# Patient Record
Sex: Male | Born: 1985 | Race: White | Hispanic: No | Marital: Married | Smoking: Current every day smoker
Health system: Southern US, Community
[De-identification: ages and names within clinical notes are randomized; demographics above are authoritative.]

## PROBLEM LIST (undated history)

## (undated) DIAGNOSIS — J189 Pneumonia, unspecified organism: Secondary | ICD-10-CM

## (undated) DIAGNOSIS — I456 Pre-excitation syndrome: Secondary | ICD-10-CM

---
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
## 2011-09-13 ENCOUNTER — Emergency Department (HOSPITAL_COMMUNITY)
Admission: EM | Admit: 2011-09-13 | Discharge: 2011-09-13 | Disposition: A | Discharge status: Home / Self Care | Payer: Self-pay | Attending: Emergency Medicine | Admitting: Emergency Medicine

## 2011-09-13 ENCOUNTER — Emergency Department (HOSPITAL_COMMUNITY): Payer: Self-pay

## 2011-09-13 DIAGNOSIS — Y998 Other external cause status: Secondary | ICD-10-CM | POA: Insufficient documentation

## 2011-09-13 DIAGNOSIS — Y9389 Activity, other specified: Secondary | ICD-10-CM | POA: Insufficient documentation

## 2011-09-13 DIAGNOSIS — W3302XA Accidental discharge of hunting rifle, initial encounter: Secondary | ICD-10-CM | POA: Insufficient documentation

## 2011-09-13 DIAGNOSIS — S92309B Fracture of unspecified metatarsal bone(s), unspecified foot, initial encounter for open fracture: Secondary | ICD-10-CM | POA: Insufficient documentation

## 2011-09-13 DIAGNOSIS — W3400XA Accidental discharge from unspecified firearms or gun, initial encounter: Secondary | ICD-10-CM

## 2011-09-13 MED ORDER — SODIUM CHLORIDE 0.9 % IV SOLN: 1.0000 mg/h | INTRAVENOUS | Status: DC

## 2011-09-13 MED ORDER — HYDROMORPHONE HCL PF 1 MG/ML IJ SOLN
1.0000 mg | Freq: Once | INTRAMUSCULAR | Status: AC
  Administered 2011-09-13: 1 mg via INTRAVENOUS
  Filled 2011-09-13: qty 1

## 2011-09-13 MED ORDER — CEPHALEXIN 500 MG PO CAPS: 500.0000 mg | ORAL_CAPSULE | Freq: Four times a day (QID) | ORAL | Status: AC

## 2011-09-13 MED ORDER — HYDROMORPHONE BOLUS VIA INFUSION: 1.0000 mg | Freq: Once | INTRAVENOUS | Status: DC

## 2011-09-13 MED ORDER — OXYCODONE-ACETAMINOPHEN 5-325 MG PO TABS: 1.0000 | ORAL_TABLET | Freq: Four times a day (QID) | ORAL | Status: DC | PRN

## 2011-09-13 MED ORDER — HYDROMORPHONE HCL PF 1 MG/ML IJ SOLN
INTRAMUSCULAR | Status: AC
  Administered 2011-09-13: 1 mg
  Filled 2011-09-13: qty 1
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
## 2011-09-13 NOTE — Progress Notes (Signed)
Orthopedic Tech Progress Note Patient Details:  Mason Coleman [birth year] [id number]  Ortho Devices Type of Ortho Device: Crutches;Postop boot Ortho Device/Splint Location: (L) LE Ortho Device/Splint Interventions: Application   [name] 09/13/2011, 4:29 PM

## 2011-09-13 NOTE — ED Notes (Signed)
Wound care with 4x4, korlex, and saline
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
## 2011-10-17 ENCOUNTER — Encounter (HOSPITAL_COMMUNITY): Payer: Self-pay | Admitting: Emergency Medicine

## 2011-10-17 ENCOUNTER — Emergency Department (HOSPITAL_COMMUNITY)
Admission: EM | Admit: 2011-10-17 | Discharge: 2011-10-17 | Disposition: A | Discharge status: Home / Self Care | Payer: Self-pay | Attending: Emergency Medicine | Admitting: Emergency Medicine

## 2011-10-17 DIAGNOSIS — F1910 Other psychoactive substance abuse, uncomplicated: Secondary | ICD-10-CM

## 2011-10-17 DIAGNOSIS — F1610 Hallucinogen abuse, uncomplicated: Secondary | ICD-10-CM | POA: Insufficient documentation

## 2011-10-17 DIAGNOSIS — F17200 Nicotine dependence, unspecified, uncomplicated: Secondary | ICD-10-CM | POA: Insufficient documentation

## 2011-10-17 LAB — RAPID URINE DRUG SCREEN, HOSP PERFORMED
Amphetamines: NOT DETECTED
Barbiturates: NOT DETECTED
Benzodiazepines: NOT DETECTED
Cocaine: NOT DETECTED
Tetrahydrocannabinol: POSITIVE — AB

## 2011-10-17 LAB — COMPREHENSIVE METABOLIC PANEL
ALT: 21 U/L (ref 0–53)
AST: 14 U/L (ref 0–37)
Alkaline Phosphatase: 79 U/L (ref 39–117)
Calcium: 10.5 mg/dL (ref 8.4–10.5)
Potassium: 4 mEq/L (ref 3.5–5.1)
Sodium: 141 mEq/L (ref 135–145)
Total Protein: 8.4 g/dL — ABNORMAL HIGH (ref 6.0–8.3)

## 2011-10-17 LAB — CBC
MCH: 27.2 pg (ref 26.0–34.0)
MCHC: 33.1 g/dL (ref 30.0–36.0)
Platelets: 232 10*3/uL (ref 150–400)
RBC: 4.86 MIL/uL (ref 4.22–5.81)

## 2011-10-17 MED ORDER — NICOTINE 21 MG/24HR TD PT24
21.0000 mg | MEDICATED_PATCH | Freq: Every day | TRANSDERMAL | Status: DC
  Administered 2011-10-17: 21 mg via TRANSDERMAL
  Filled 2011-10-17: qty 1

## 2011-10-17 MED ORDER — LORAZEPAM 1 MG PO TABS
1.0000 mg | ORAL_TABLET | Freq: Three times a day (TID) | ORAL | Status: DC | PRN
  Administered 2011-10-17: 1 mg via ORAL
  Filled 2011-10-17: qty 1

## 2011-10-17 MED ORDER — ACETAMINOPHEN 325 MG PO TABS: 650.0000 mg | ORAL_TABLET | ORAL | Status: DC | PRN

## 2011-10-17 MED ORDER — ALUM & MAG HYDROXIDE-SIMETH 200-200-20 MG/5ML PO SUSP: 30.0000 mL | ORAL | Status: DC | PRN

## 2011-10-17 MED ORDER — ONDANSETRON HCL 8 MG PO TABS: 4.0000 mg | ORAL_TABLET | Freq: Three times a day (TID) | ORAL | Status: DC | PRN

## 2011-10-17 MED ORDER — ZOLPIDEM TARTRATE 5 MG PO TABS: 5.0000 mg | ORAL_TABLET | Freq: Every evening | ORAL | Status: DC | PRN
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
## 2011-10-17 NOTE — BH Assessment (Signed)
Assessment Note  Update:  Pt requesting to talk to social worker regarding a place to live as he is homeless.  Pt stated he no longer wants detox.  Pt seen by Frederico Hamman, CSW for homeless referrals as well as was given outpatient SA referrals.  EDP Pickering discharged home per pt request.  ED staff updated.     Disposition:  Disposition Disposition of Patient: Referred to;Outpatient treatment Type of inpatient treatment program: Adult Type of outpatient treatment: Adult Patient referred to: Outpatient clinic referral  On Site Evaluation by:   Reviewed with Physician:  Thornton Papas, Rennis Harding 10/17/2011 2:53 PM
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
## 2011-10-17 NOTE — ED Notes (Signed)
MD at bedside. 

## 2011-10-17 NOTE — ED Notes (Signed)
Patient placed in scrubs and all belongings removed from room.

## 2011-10-17 NOTE — ED Notes (Signed)
Social worker at bedside, referral given. Pt ambulatory to lobby via crutches.

## 2011-10-17 NOTE — ED Notes (Signed)
Pt requesting to speak to someone about living arrangements.  Pt reports he was living with his uncle and has now been kicked out of that residence. Pt reports he is now homeless and has nowhere to go.

## 2011-10-17 NOTE — ED Notes (Signed)
Pt is walking up to nurse's desk and states "this place is making me more depressed".  Pt requesting to speak to counselor and reports "I might just leave".  Counselor at bedside and reports pt is requesting Child psychotherapist for placement in shelters.  Lunch meal given, pt brings tea up to nurse's desk and reports "I'm allergic to tea", water provided.
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
## 2011-10-17 NOTE — ED Notes (Signed)
Pt is requesting benadryl 50mg  to be taken before each meal.  Pt reports "it doesn't matter what I eat, I'll break out in a rash".  Pt unable to give any specific food allergy.

## 2011-10-17 NOTE — ED Notes (Signed)
CSW met with Pt to discuss homeless referrals. Pt has been staying with family members on a temporarily basis for several months now. Pt reports hx of opiate abuse, is vague in the length of his use and the reasons for his use. Pt reports current depression as well. Pt stated that he came to the ED for detox from opiates, which he has never experienced before now. Pt stated that he is having a difficult time staying in the ED and wants to go talk to his "mother-in-law" to see if she can help him. Pt asking CSW for help finding a place to stay but is not interested in staying in a shelter at this time. CSW used motivational interviewing to discuss detox and tx process. Pt reports that he wants to quit using but does not want to stay in the ED. CSW discussed IRC and programs at the [facility] that Pt might be interested in.  Pt plans to meet family member in the atrium at dc. CSW updated [facility] re: Pt's dc plans. No further CSW needs at this time.   Frederico Hamman, LCSW [phone]

## 2011-10-17 NOTE — ED Provider Notes (Signed)
  Physical Exam  BP 135/79  Pulse 65  Temp 98.6 F (37 C) (Oral)  Resp 20  SpO2 98%  Physical Exam  ED Course  Procedures  MDM Patient is here for detox off the pain pills. After evaluation he no longer wants to stay. He is not suicidal homicidal. he will be discharged. Was given resources for followup.      Juliet Rude. Rubin Payor, MD 10/17/11 [phone]
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
## 2011-10-17 NOTE — ED Notes (Signed)
Pt. Shoe, and short [blue] [blue tee-shirt] ,brown wallet.with  No cash in it. Pt.walks with crutches.and they are with pt.every thing is in belongs bag.

## 2012-02-26 DIAGNOSIS — J189 Pneumonia, unspecified organism: Secondary | ICD-10-CM

## 2012-02-26 HISTORY — DX: Pneumonia, unspecified organism: J18.9

## 2012-10-02 ENCOUNTER — Encounter (HOSPITAL_COMMUNITY): Payer: Self-pay | Admitting: Emergency Medicine

## 2012-10-02 ENCOUNTER — Emergency Department (HOSPITAL_COMMUNITY)
Admission: EM | Admit: 2012-10-02 | Discharge: 2012-10-02 | Disposition: A | Discharge status: Home / Self Care | Payer: Self-pay | Attending: Emergency Medicine | Admitting: Emergency Medicine

## 2012-10-02 ENCOUNTER — Emergency Department (HOSPITAL_COMMUNITY): Payer: Self-pay

## 2012-10-02 DIAGNOSIS — R1012 Left upper quadrant pain: Secondary | ICD-10-CM | POA: Insufficient documentation

## 2012-10-02 DIAGNOSIS — J159 Unspecified bacterial pneumonia: Secondary | ICD-10-CM | POA: Insufficient documentation

## 2012-10-02 DIAGNOSIS — R059 Cough, unspecified: Secondary | ICD-10-CM | POA: Insufficient documentation

## 2012-10-02 DIAGNOSIS — R05 Cough: Secondary | ICD-10-CM | POA: Insufficient documentation

## 2012-10-02 DIAGNOSIS — J189 Pneumonia, unspecified organism: Secondary | ICD-10-CM

## 2012-10-02 DIAGNOSIS — F17200 Nicotine dependence, unspecified, uncomplicated: Secondary | ICD-10-CM | POA: Insufficient documentation

## 2012-10-02 DIAGNOSIS — R109 Unspecified abdominal pain: Secondary | ICD-10-CM

## 2012-10-02 LAB — CBC WITH DIFFERENTIAL/PLATELET
Basophils Absolute: 0 10*3/uL (ref 0.0–0.1)
Eosinophils Absolute: 0.2 10*3/uL (ref 0.0–0.7)
Eosinophils Relative: 2 % (ref 0–5)
HCT: 40.1 % (ref 39.0–52.0)
Lymphocytes Relative: 31 % (ref 12–46)
MCH: 27.2 pg (ref 26.0–34.0)
MCHC: 32.9 g/dL (ref 30.0–36.0)
MCV: 82.7 fL (ref 78.0–100.0)
Monocytes Absolute: 0.6 10*3/uL (ref 0.1–1.0)
RDW: 14.1 % (ref 11.5–15.5)
WBC: 7.5 10*3/uL (ref 4.0–10.5)

## 2012-10-02 LAB — URINALYSIS, ROUTINE W REFLEX MICROSCOPIC
Glucose, UA: NEGATIVE mg/dL
Hgb urine dipstick: NEGATIVE
Ketones, ur: NEGATIVE mg/dL
Leukocytes, UA: NEGATIVE
Protein, ur: NEGATIVE mg/dL

## 2012-10-02 LAB — BASIC METABOLIC PANEL
CO2: 25 mEq/L (ref 19–32)
Calcium: 10.3 mg/dL (ref 8.4–10.5)
Creatinine, Ser: 0.9 mg/dL (ref 0.50–1.35)
GFR calc non Af Amer: >90 mL/min (ref >90)

## 2012-10-02 MED ORDER — IOHEXOL 300 MG/ML  SOLN
100.0000 mL | Freq: Once | INTRAMUSCULAR | Status: AC | PRN
  Administered 2012-10-02: 100 mL via INTRAVENOUS

## 2012-10-02 MED ORDER — MORPHINE SULFATE 4 MG/ML IJ SOLN
4.0000 mg | Freq: Once | INTRAMUSCULAR | Status: AC
  Administered 2012-10-02: 4 mg via INTRAVENOUS
  Filled 2012-10-02: qty 1

## 2012-10-02 MED ORDER — HYDROCODONE-ACETAMINOPHEN 5-325 MG PO TABS: 2.0000 | ORAL_TABLET | ORAL | Status: DC | PRN

## 2012-10-02 MED ORDER — AZITHROMYCIN 250 MG PO TABS: ORAL_TABLET | ORAL | Status: DC

## 2012-10-02 MED ORDER — IOHEXOL 300 MG/ML  SOLN
25.0000 mL | INTRAMUSCULAR | Status: AC
  Administered 2012-10-02: 25 mL via ORAL

## 2012-10-02 MED ORDER — ONDANSETRON HCL 4 MG/2ML IJ SOLN
4.0000 mg | Freq: Once | INTRAMUSCULAR | Status: AC
  Administered 2012-10-02: 4 mg via INTRAVENOUS
  Filled 2012-10-02: qty 2
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
## 2012-10-02 NOTE — ED Notes (Signed)
Pt c/o right lower abd pain that started a week ago, sts he went to the [facility] ED but unsure of the results. Sts the pain is getting worse and he cant get rid of it. Denies urinary issues. Pt in nad, skin warm and dry, resp e/u.

## 2012-10-02 NOTE — ED Notes (Signed)
Pt requesting pain medicine. Dr. Blinda Leatherwood made aware.

## 2012-10-02 NOTE — ED Provider Notes (Signed)
CSN: [id number]     Arrival date & time 10/02/12  1240 History     First MD Initiated Contact with Patient 10/02/12 1247     Chief Complaint  Patient presents with  . Abdominal Pain   (Consider location/radiation/quality/duration/timing/severity/associated sxs/prior Treatment) HPI Comments: Patient presents to the ER for evaluation of abdominal pain. Patient reports that the pain began about a week ago. Pain is in the right lower abdomen it has progressively worsened since it started. Patient reports that the pain is now severe. He says that he was seen at another ER and had an ultrasound. He has not had any nausea, vomiting or diarrhea. Patient denies urinary symptoms.  Patient is a 27 y.o. male presenting with abdominal pain.  Abdominal Pain Associated symptoms: cough   Associated symptoms: no fever     History reviewed. No pertinent past medical history. History reviewed. No pertinent past surgical history. No family history on file. History  Substance Use Topics  . Smoking status: Current Every Day Smoker -- 0.50 packs/day    Types: Cigarettes  . Smokeless tobacco: Not on file  . Alcohol Use: No    Review of Systems  Constitutional: Negative for fever.  Respiratory: Positive for cough.   Gastrointestinal: Positive for abdominal pain.  All other systems reviewed and are negative.    Allergies  Ibuprofen  Home Medications   Current Outpatient Rx  Name  Route  Sig  Dispense  Refill  . oxyCODONE (OXY IR/ROXICODONE) 5 MG immediate release tablet   Oral   Take 5 mg by mouth every 4 (four) hours as needed. For foot pain.          BP 123/78  Pulse 88  Temp(Src) 97.1 F (36.2 C) (Oral)  Resp 18  SpO2 99% Physical Exam  Constitutional: He is oriented to person, place, and time. He appears well-developed and well-nourished. No distress.  HENT:  Head: Normocephalic and atraumatic.  Right Ear: Hearing normal.  Left Ear: Hearing normal.  Nose: Nose normal.   Mouth/Throat: Oropharynx is clear and moist and mucous membranes are normal.  Eyes: Conjunctivae and EOM are normal. Pupils are equal, round, and reactive to light.  Neck: Normal range of motion. Neck supple.  Cardiovascular: Regular rhythm, S1 normal and S2 normal.  Exam reveals no gallop and no friction rub.   No murmur heard. Pulmonary/Chest: Effort normal and breath sounds normal. No respiratory distress. He exhibits no tenderness.  Abdominal: Soft. Normal appearance and bowel sounds are normal. There is no hepatosplenomegaly. There is tenderness in the right lower quadrant. There is guarding (voluntary). There is no rebound, no tenderness at McBurney's point and negative Murphy's sign. No hernia.  Musculoskeletal: Normal range of motion.  Neurological: He is alert and oriented to person, place, and time. He has normal strength. No cranial nerve deficit or sensory deficit. Coordination normal. GCS eye subscore is 4. GCS verbal subscore is 5. GCS motor subscore is 6.  Skin: Skin is warm, dry and intact. No rash noted. No cyanosis.  Psychiatric: He has a normal mood and affect. His speech is normal and behavior is normal. Thought content normal.    ED Course   Procedures (including critical care time)  Labs Reviewed  CBC WITH DIFFERENTIAL  BASIC METABOLIC PANEL  URINALYSIS, ROUTINE W REFLEX MICROSCOPIC   Ct Abdomen Pelvis W Contrast  10/02/2012   *RADIOLOGY REPORT*  Clinical Data: Right lower and upper quadrant pain for 1 week.  CT ABDOMEN AND PELVIS WITH CONTRAST  Technique:  Multidetector CT imaging of the abdomen and pelvis was performed following the standard protocol during bolus administration of intravenous contrast.  Contrast: OMNIPAQUE IOHEXOL 300 MG/ML  SOLN  Comparison: None.  Findings: Lung Bases: Motion artifact is present at the lung bases. Small areas of tree in bud micronodularity are present in the left lower lobe, compatible with bronchopneumonia.  These types of  opacities in this age group are almost always related to infection.  Liver:  Normal.  Spleen:  Normal.  Gallbladder:  Normal. No inflammatory changes by CT.  Common bile duct:  Normal.  Pancreas:  Normal.  Adrenal glands:  Normal.  Kidneys:  Normal enhancement. On the coronal images, there are tiny sub centimeter lesions in the interpolar kidneys compatible with tiny cysts.  The ureters appear within normal limits.  Stomach:  Patulous gastroesophageal junction.  No mural thickening or inflammatory changes of the stomach.  Small bowel:  Normal duodenum. Small bowel is decompressed.  No mesenteric adenopathy.  Colon:   Normal appendix.  No inflammatory changes of the right lower quadrant. Distal colon appears normal.  Pelvic Genitourinary:  Urinary bladder appears normal.  No free fluid.  Bones:  No aggressive osseous lesions.  Spinal alignment is within normal limits.  No fractures.  Schmorl's nodes in the lower thoracic spine.  Vasculature: Normal.  Portal venous vasculature also appears normal.  Body Wall: Normal.  IMPRESSION: Tree in bud micronodularity in the left lower lobe is compatible with bronchopneumonia.  Normal appendix.  No acute intra-abdominal abnormality.   Original Report Authenticated By: Andreas Newport, M.D.   Diagnosis: 1. Abdominal pain 2. Pneumonia  MDM  Patient presents to ER with complaints of right lower abdominal pain. Patient reports the symptoms have been ongoing for one week. Patient had a gallbladder ultrasound at an outside institution this week for these symptoms and it was reportedly negative. Patient has continued pain, now indicates that it is in the right lower quadrant. Examination of the abdomen reveals no right upper quadrant tenderness. Negative Murphy sign. He does have tenderness in the right lower quadrant, however. CAT scan was therefore performed to further evaluate for possible appendicitis. CAT scan shows no intra-abdominal pathology. He does have evidence of  pneumonia in the left lung. Further discussion with the patient does reveal that he does have a history of recent cough but no fever. This will be treated empirically.  Gilda Crease, MD 10/02/12 [phone]

## 2012-10-02 NOTE — ED Notes (Signed)
Pt sts he can't drink the contrast, it just doesn't taste good and he is still having some nausea. Pt told the importance of drinking the contrast. Will notify MD.

## 2012-10-05 ENCOUNTER — Emergency Department (HOSPITAL_COMMUNITY)
Admission: EM | Admit: 2012-10-05 | Discharge: 2012-10-05 | Disposition: A | Discharge status: Home / Self Care | Payer: Self-pay | Attending: Emergency Medicine | Admitting: Emergency Medicine

## 2012-10-05 ENCOUNTER — Emergency Department (HOSPITAL_COMMUNITY): Payer: Self-pay

## 2012-10-05 ENCOUNTER — Encounter (HOSPITAL_COMMUNITY): Payer: Self-pay | Admitting: Emergency Medicine

## 2012-10-05 DIAGNOSIS — R109 Unspecified abdominal pain: Secondary | ICD-10-CM

## 2012-10-05 DIAGNOSIS — R1011 Right upper quadrant pain: Secondary | ICD-10-CM

## 2012-10-05 DIAGNOSIS — J189 Pneumonia, unspecified organism: Secondary | ICD-10-CM

## 2012-10-05 DIAGNOSIS — F17200 Nicotine dependence, unspecified, uncomplicated: Secondary | ICD-10-CM | POA: Insufficient documentation

## 2012-10-05 HISTORY — DX: Pneumonia, unspecified organism: J18.9

## 2012-10-05 LAB — CBC WITH DIFFERENTIAL/PLATELET
Eosinophils Absolute: 0.4 10*3/uL (ref 0.0–0.7)
Lymphocytes Relative: 33 % (ref 12–46)
Lymphs Abs: 3.7 10*3/uL (ref 0.7–4.0)
Neutrophils Relative %: 55 % (ref 43–77)
Platelets: 230 10*3/uL (ref 150–400)
RBC: 4.5 MIL/uL (ref 4.22–5.81)
WBC: 11.2 10*3/uL — ABNORMAL HIGH (ref 4.0–10.5)

## 2012-10-05 LAB — COMPREHENSIVE METABOLIC PANEL
CO2: 25 mEq/L (ref 19–32)
Calcium: 9.7 mg/dL (ref 8.4–10.5)
Creatinine, Ser: 1.14 mg/dL (ref 0.50–1.35)
GFR calc Af Amer: >90 mL/min (ref >90)
GFR calc non Af Amer: 88 mL/min — ABNORMAL LOW (ref >90)
Glucose, Bld: 90 mg/dL (ref 70–99)

## 2012-10-05 MED ORDER — HYDROMORPHONE HCL PF 1 MG/ML IJ SOLN
1.0000 mg | Freq: Once | INTRAMUSCULAR | Status: AC
  Administered 2012-10-05: 1 mg via INTRAVENOUS
  Filled 2012-10-05: qty 1

## 2012-10-05 MED ORDER — AZITHROMYCIN 250 MG PO TABS
500.0000 mg | ORAL_TABLET | Freq: Once | ORAL | Status: AC
  Administered 2012-10-05: 500 mg via ORAL
  Filled 2012-10-05: qty 2

## 2012-10-05 NOTE — ED Provider Notes (Signed)
CSN: [id number]     Arrival date & time 10/05/12  1905 History     First MD Initiated Contact with Patient 10/05/12 1916     Chief Complaint  Patient presents with  . Abdominal Pain   (Consider location/radiation/quality/duration/timing/severity/associated sxs/prior Treatment) Patient is a 27 y.o. male presenting with abdominal pain.  Abdominal Pain Associated symptoms: chest pain (under right 12th rib), chills, cough and fever   Associated symptoms: no constipation, no diarrhea, no dysuria, no fatigue, no nausea, no shortness of breath and no vomiting    Mason Coleman is a 27 year old male who returns to the ED for worsening abdominal pain.  He was seen on 10/02/12 for RLQ abdominal pain and after appendicitis was ruled out with CTA he was discharged with a prescription of Azithromycin.  He reports that he never filled that prescription. Since then he reports he has had increasing RUQ pain located under is rib.  He reports the pain is worse with deep inspiration, cough, and sitting leaning forward.  He currently rates the pain as a 9/10 and reports that it was a 7-8/10 before.  He describes the pain as a constant stabbing pain.  He reports Aleve and Tylenol did not help his pain. He does report fever and chills.  He denies nausea and vomiting. He does not currently have a PCP, he reports he is currently unemployed (used to work [employer]).  Past Medical History  Diagnosis Date  . Pneumonia 2014   History reviewed. No pertinent past surgical history. History reviewed. No pertinent family history. History  Substance Use Topics  . Smoking status: Current Every Day Smoker -- 0.50 packs/day    Types: Cigarettes  . Smokeless tobacco: Not on file  . Alcohol Use: No    Review of Systems  Constitutional: Positive for fever and chills. Negative for fatigue.  HENT: Negative for sinus pressure.   Eyes: Negative for visual disturbance.  Respiratory: Positive for cough. Negative for chest  tightness and shortness of breath.   Cardiovascular: Positive for chest pain (under right 12th rib). Negative for palpitations.  Gastrointestinal: Positive for abdominal pain. Negative for nausea, vomiting, diarrhea and constipation.  Genitourinary: Negative for dysuria, frequency, flank pain and testicular pain.  Musculoskeletal: Negative for back pain.  Neurological: Negative for dizziness, weakness and headaches.    Allergies  Cheese and Ibuprofen  Home Medications   Current Outpatient Rx  Name  Route  Sig  Dispense  Refill  . Naproxen Sodium (ALEVE) 220 MG CAPS   Oral   Take 220 mg by mouth 2 (two) times daily as needed (pain).         Marland Kitchen azithromycin (ZITHROMAX Z-PAK) 250 MG tablet      2 po day one, then 1 daily x 4 days   6 tablet   0   . HYDROcodone-acetaminophen (NORCO/VICODIN) 5-325 MG per tablet   Oral   Take 2 tablets by mouth every 4 (four) hours as needed for pain.   10 tablet   0    BP 135/74  Temp(Src) 99.3 F (37.4 C)  Resp 18  SpO2 99% Physical Exam  Constitutional: He is oriented to person, place, and time. He appears well-developed and well-nourished.  Cardiovascular: Normal rate, regular rhythm and normal heart sounds.  Exam reveals no friction rub.   No murmur heard. Pulmonary/Chest: Effort normal and breath sounds normal. No respiratory distress. He has no wheezes. He has no rales. He exhibits no tenderness.  Abdominal: Soft. Bowel  sounds are normal. He exhibits no distension and no mass. There is tenderness (RUQ TTP). There is guarding (voluntary). There is no rebound.  Neurological: He is alert and oriented to person, place, and time.  Skin: Skin is warm and dry.    ED Course   Procedures (including critical care time)  Labs Reviewed  COMPREHENSIVE METABOLIC PANEL  LIPASE, BLOOD  CBC WITH DIFFERENTIAL   No results found. 1. Abdominal pain   2. Community acquired pneumonia     MDM  27 year old male recently diagnosed with PNA who  returns to ED for increasing RUQ abdominal pain.  During last ER visit patient had reported workup including ultrasound that was negative.  Since that is not in our records and he returns for increasing pain will order RUQ U/S now, will also obtain CMP and lipase to evaluate for GB disease and pancreatitis.   On pervious ED visit he was diagnosed with CAP but reports he did not fill prescription for Z-Pack. -Will obtain CBC -Dilaudid 1mg  for pain. -Consult care management to discuss options for affording medication.   Care discussed and transferred to night mid level provider Fayrene Helper.     Carlynn Purl, DO 10/05/12 2020

## 2012-10-05 NOTE — ED Notes (Addendum)
Pt to ED via EMS with c/o abdominal pain and cough, onset 10/02/12. Pt was seen here on that date and diagnosed wut pneumonia but did not fill prescription. BP-119/79, HR-100.

## 2012-10-05 NOTE — ED Provider Notes (Signed)
[name] abd.  Had L pna 3 days ago, on zithromax.  Social work to see for [employer] care.  If Mason Coleman neg, then pt can go home with abx treatment for pna.    10:48 PM Mason Coleman shows no acute abnormalities.  Pt stable for discharge.  Pt will refill zithromax and pain medication as previously prescribed.    BP 128/73  Pulse 74  Temp(Src) 98.1 F (36.7 C) (Oral)  Resp 14  SpO2 97%  I have reviewed nursing notes and vital signs. I personally reviewed the imaging tests through PACS system  I reviewed available ER/hospitalization records thought the EMR  Results for orders placed during the hospital encounter of 10/05/12  COMPREHENSIVE METABOLIC PANEL      Result Value Range   Sodium 139  135 - 145 mEq/L   Potassium 3.8  3.5 - 5.1 mEq/L   Chloride 101  96 - 112 mEq/L   CO2 25  19 - 32 mEq/L   Glucose, Bld 90  70 - 99 mg/dL   BUN 12  6 - 23 mg/dL   Creatinine, Ser 4.54  0.50 - 1.35 mg/dL   Calcium 9.7  8.4 - 09.8 mg/dL   Total Protein 8.0  6.0 - 8.3 g/dL   Albumin 4.2  3.5 - 5.2 g/dL   AST 14  0 - 37 U/L   ALT 15  0 - 53 U/L   Alkaline Phosphatase 98  39 - 117 U/L   Total Bilirubin 0.5  0.3 - 1.2 mg/dL   GFR calc non Af Amer 88 (*) >90 mL/min   GFR calc Af Amer >90  >90 mL/min  LIPASE, BLOOD      Result Value Range   Lipase 24  11 - 59 U/L  CBC WITH DIFFERENTIAL      Result Value Range   WBC 11.2 (*) 4.0 - 10.5 K/uL   RBC 4.50  4.22 - 5.81 MIL/uL   Hemoglobin 12.7 (*) 13.0 - 17.0 g/dL   HCT 11.9 (*) 14.7 - 82.9 %   MCV 81.8  78.0 - 100.0 fL   MCH 28.2  26.0 - 34.0 pg   MCHC 34.5  30.0 - 36.0 g/dL   RDW 56.2  13.0 - 86.5 %   Platelets 230  150 - 400 K/uL   Neutrophils Relative % 55  43 - 77 %   Neutro Abs 6.1  1.7 - 7.7 K/uL   Lymphocytes Relative 33  12 - 46 %   Lymphs Abs 3.7  0.7 - 4.0 K/uL   Monocytes Relative 9  3 - 12 %   Monocytes Absolute 1.0  0.1 - 1.0 K/uL   Eosinophils Relative 4  0 - 5 %   Eosinophils Absolute 0.4  0.0 - 0.7 K/uL   Basophils Relative 0  0 - 1 %   Basophils Absolute 0.1  0.0 - 0.1 K/uL   Mason Coleman Abdomen Complete  10/05/2012   *RADIOLOGY REPORT*  Clinical Data:  Right upper quadrant abdominal pain  ABDOMINAL ULTRASOUND COMPLETE  Comparison:  CT 10/02/2012  Findings:  Gallbladder:  Decompressed but otherwise unremarkable.  No stone identified.  No gallbladder wall thickening, pericholecystic fluid, or sonographic Murphy's sign.  Common Bile Duct:  Not well visualized.  Two images #67 and # 68 provide measurements of a structure thought by the sonographer to corresponding to the common duct, but this structure does not anatomically appear to correspond to the normal landmarks for the common duct. If this were  to represent the common duct, its maximal diameter of 7 mm would be at the upper limits of normal.  Liver: No focal mass lesion identified.  Within normal limits in parenchymal echogenicity.  IVC:  Appears normal.  Pancreas:  Head and body appear normal.  Tail obscured by bowel gas.  Spleen:  Within normal limits in size and echotexture.  Right kidney:  Normal in size and parenchymal echogenicity.  No evidence of mass or hydronephrosis.  Left kidney:  Normal in size and parenchymal echogenicity.  No evidence of mass or hydronephrosis.  Abdominal Aorta:  No aneurysm identified.  IMPRESSION: Negative abdominal ultrasound.   Original Report Authenticated By: Christiana Pellant, M.D.   Ct Abdomen Pelvis W Contrast  10/02/2012   *RADIOLOGY REPORT*  Clinical Data: Right lower and upper quadrant pain for 1 week.  CT ABDOMEN AND PELVIS WITH CONTRAST  Technique:  Multidetector CT imaging of the abdomen and pelvis was performed following the standard protocol during bolus administration of intravenous contrast.  Contrast: OMNIPAQUE IOHEXOL 300 MG/ML  SOLN  Comparison: None.  Findings: Lung Bases: Motion artifact is present at the lung bases. Small areas of tree in bud micronodularity are present in the left lower lobe, compatible with bronchopneumonia.  These types  of opacities in this age group are almost always related to infection.  Liver:  Normal.  Spleen:  Normal.  Gallbladder:  Normal. No inflammatory changes by CT.  Common bile duct:  Normal.  Pancreas:  Normal.  Adrenal glands:  Normal.  Kidneys:  Normal enhancement. On the coronal images, there are tiny sub centimeter lesions in the interpolar kidneys compatible with tiny cysts.  The ureters appear within normal limits.  Stomach:  Patulous gastroesophageal junction.  No mural thickening or inflammatory changes of the stomach.  Small bowel:  Normal duodenum. Small bowel is decompressed.  No mesenteric adenopathy.  Colon:   Normal appendix.  No inflammatory changes of the right lower quadrant. Distal colon appears normal.  Pelvic Genitourinary:  Urinary bladder appears normal.  No free fluid.  Bones:  No aggressive osseous lesions.  Spinal alignment is within normal limits.  No fractures.  Schmorl's nodes in the lower thoracic spine.  Vasculature: Normal.  Portal venous vasculature also appears normal.  Body Wall: Normal.  IMPRESSION: Tree in bud micronodularity in the left lower lobe is compatible with bronchopneumonia.  Normal appendix.  No acute intra-abdominal abnormality.   Original Report Authenticated By: Andreas Newport, M.D.      Fayrene Helper, PA-C 10/05/12 2252

## 2012-10-05 NOTE — Progress Notes (Signed)
ED CM consulted by Dr. Lou Miner EDP. To see patient. Pt presents to ED with Abdominal pan and fever. In to see patient regarding Mediatation assistance.HE STATES, HE UNABLE TO AFFORD MEDICATIONS AT THIS TIME. DISCUSSED THE MATCH PROGRAM AND GUIDELNES. EXPLAINED IT IS A ONE TIME ASSISTANCE MED PROGRAM WITH A $3.00 CO-PAY PER PERSCRIPTION AND THE LIST OF PHARMACIES THAT PARTCIPATE. PT STATES, HE WILL BE ABLE TO AFFORD THE CO-PAY. PATIENT VERBALIZES UNDERSTANDING AND AGREES.  PT ELGIBLE AND WAS ENROLLED INTO THE PROGRAM.  MATCH LETTER GIVEN. . VERBAL AND WRITTEN INFORMATION GIVEN TO FOLLOW UP WITH [facility] AND [facility]. PT VERBALIZED UNDERSTANDING ON THE IMPORTANCE OF F/U CARE. NO FURTHER CM NEEDS IDENTIFIED AT THIS TIME. Marland Kitchen

## 2012-10-06 NOTE — ED Provider Notes (Signed)
I saw and evaluated the patient, reviewed the resident's note and I agree with the findings and plan.   Abd pain with mild RUQ tenderness, no acute surgical abdomen. Will eval with labs and u/s.  Audree Camel, MD 10/06/12 [phone]

## 2012-10-07 ENCOUNTER — Emergency Department (HOSPITAL_COMMUNITY): Payer: Self-pay

## 2012-10-07 ENCOUNTER — Emergency Department (HOSPITAL_COMMUNITY)
Admission: EM | Admit: 2012-10-07 | Discharge: 2012-10-07 | Disposition: A | Discharge status: Home / Self Care | Payer: Self-pay | Attending: Emergency Medicine | Admitting: Emergency Medicine

## 2012-10-07 ENCOUNTER — Encounter (HOSPITAL_COMMUNITY): Payer: Self-pay | Admitting: Emergency Medicine

## 2012-10-07 DIAGNOSIS — S62309A Unspecified fracture of unspecified metacarpal bone, initial encounter for closed fracture: Secondary | ICD-10-CM | POA: Insufficient documentation

## 2012-10-07 DIAGNOSIS — Y929 Unspecified place or not applicable: Secondary | ICD-10-CM | POA: Insufficient documentation

## 2012-10-07 DIAGNOSIS — Y9389 Activity, other specified: Secondary | ICD-10-CM | POA: Insufficient documentation

## 2012-10-07 DIAGNOSIS — F17200 Nicotine dependence, unspecified, uncomplicated: Secondary | ICD-10-CM | POA: Insufficient documentation

## 2012-10-07 DIAGNOSIS — Z792 Long term (current) use of antibiotics: Secondary | ICD-10-CM | POA: Insufficient documentation

## 2012-10-07 DIAGNOSIS — IMO0002 Reserved for concepts with insufficient information to code with codable children: Secondary | ICD-10-CM | POA: Insufficient documentation

## 2012-10-07 DIAGNOSIS — Z8701 Personal history of pneumonia (recurrent): Secondary | ICD-10-CM | POA: Insufficient documentation

## 2012-10-07 MED ORDER — ACETAMINOPHEN 325 MG PO TABS
650.0000 mg | ORAL_TABLET | Freq: Once | ORAL | Status: AC
  Administered 2012-10-07: 650 mg via ORAL
  Filled 2012-10-07: qty 2

## 2012-10-07 NOTE — ED Notes (Addendum)
Pt punched tree bc he was stressed out and upset. Right hand reddend and mildly swollen. Pt has pneumonia in left lung dx Friday and is taking z pack for it. Denies SI or HI at this time.

## 2012-10-07 NOTE — ED Notes (Signed)
Pt refuses wrist splint. "I want to be moved to another part of the hospital'. PA in to talk with pt.

## 2012-10-07 NOTE — ED Notes (Signed)
Pt requesting transportation. Offered bus ticket. Wants a ride back to [place]. Requesting to see Child psychotherapist. SW paged.

## 2012-10-07 NOTE — ED Provider Notes (Signed)
CSN: [id number]     Arrival date & time 10/07/12  1450 History  This chart was scribed for Johnnette Gourd, PA, working with Junius Argyle, MD, by Ardelia Mems ED Scribe. This patient was seen in room TR06C/TR06C and the patient's care was started at 3:26 PM.    Chief Complaint  Patient presents with  . Hand Injury    The history is provided by the patient. No language interpreter was used.    HPI Comments: Mason Coleman is a 27 y.o. Male without significant PMH who presents to the Emergency Department complaining of constant, moderate right hand pain onset after pt "got upset" and punched a tree with his right hand. He complains of severe "10/10" pain to the hand. There is associated mild swelling to the hand. He states that he has difficulty and pain with movement of his hand, greatest when he tries to make a fist with his right hand. He denies history of right hand injury. He is calm and expresses remorse. He denies HI, SI, fever, chills or any other symptoms. He is a current every day smoker of 0.5 packs/day and denies alcohol use.   Past Medical History  Diagnosis Date  . Pneumonia 2014   History reviewed. No pertinent past surgical history. History reviewed. No pertinent family history. History  Substance Use Topics  . Smoking status: Current Every Day Smoker -- 0.50 packs/day    Types: Cigarettes  . Smokeless tobacco: Not on file  . Alcohol Use: No    Review of Systems  Constitutional: Negative for fever and chills.  HENT: Negative for neck pain.   Gastrointestinal: Negative for nausea and vomiting.  Musculoskeletal: Positive for arthralgias. Negative for back pain.  All other systems reviewed and are negative.   Allergies  Cheese and Ibuprofen  Home Medications   Current Outpatient Rx  Name  Route  Sig  Dispense  Refill  . azithromycin (ZITHROMAX) 250 MG tablet   Oral   Take 250 mg by mouth daily. 2 po day one, then 1 daily x 4 days         . Naproxen  Sodium (ALEVE) 220 MG CAPS   Oral   Take 440 mg by mouth 2 (two) times daily as needed (pain).           Triage Vitals: BP 136/90  Pulse 95  Temp(Src) 98.6 F (37 C) (Oral)  Resp 20  Ht 6\' 5"  (1.956 m)  Wt 230 lb (104.327 kg)  BMI 27.27 kg/m2  SpO2 99%  Physical Exam  Nursing note and vitals reviewed. Constitutional: He is oriented to person, place, and time. He appears well-developed and well-nourished. No distress.  HENT:  Head: Normocephalic and atraumatic.  Eyes: Conjunctivae and EOM are normal.  Neck: Normal range of motion. Neck supple.  Cardiovascular: Normal rate, regular rhythm and normal heart sounds.   +2 Radial pulse on the right. Cap refill is less than 2 seconds.  Pulmonary/Chest: Effort normal and breath sounds normal.  Musculoskeletal: Normal range of motion. He exhibits no edema.  Tender at the base of 2nd through 4th metacarpals with mild edema. Skin is intact. He has pain with hand flexion  Neurological: He is alert and oriented to person, place, and time.  Skin: Skin is warm and dry.  Psychiatric: He has a normal mood and affect. His behavior is normal.    ED Course   Procedures (including critical care time)  DIAGNOSTIC STUDIES: Oxygen Saturation is 99% on RA, normal  by my interpretation.    COORDINATION OF CARE: 3:29 PM- Pt advised of plan for treatment and pt agrees.   Labs Reviewed - No data to display RIGHT HAND - COMPLETE 3+ VIEW  Comparison: 07/20/2012  Findings: There are changes consistent with prior healed fracture of the fifth metacarpal. On the lateral projection only there is an area of lucency which is poorly visualized on the remainder of the images. It is uncertain whether this lies at the base of the healed fifth metacarpal or more in the area of clinical concern. Correlation point tenderness is recommended. No other fracture is seen.  IMPRESSION: Changes on the dorsal aspect of the hand as described and marked on the  lateral projection. This may represent an undisplaced fracture. Clinical correlation is recommended.    1. Metacarpal bone fracture, closed, initial encounter     MDM   Patient with right hand pain after punching a tree. X-ray showing an area of lucency that is poorly visualized, but clinical correlation I will treat this as a metacarpal fracture. Ulnar gutter splint applied. Skin intact. Tylenol-Motrin for pain. Followup with or so. Patient states understanding of plan and is agreeable.     I personally performed the services described in this documentation, which was scribed in my presence. The recorded information has been reviewed and is accurate.   Trevor Mace, PA-C 10/07/12 [phone]

## 2012-10-07 NOTE — ED Notes (Signed)
Social work in to speak with pt.

## 2012-10-07 NOTE — ED Notes (Signed)
Pt states he "punched a tree" about 30 min ago. Pain right hand with small amt of swelling.

## 2012-10-08 NOTE — ED Provider Notes (Signed)
Medical screening examination/treatment/procedure(s) were performed by non-physician practitioner and as supervising physician I was immediately available for consultation/collaboration.   Junius Argyle, MD 10/08/12 1046

## 2012-10-08 NOTE — ED Provider Notes (Signed)
Medical screening examination/treatment/procedure(s) were performed by non-physician practitioner and as supervising physician I was immediately available for consultation/collaboration.   Eithan Beagle W. Marysol Wellnitz, MD 10/08/12 0903 

## 2013-03-29 ENCOUNTER — Emergency Department (HOSPITAL_COMMUNITY): Payer: Self-pay

## 2013-03-29 ENCOUNTER — Emergency Department (HOSPITAL_COMMUNITY)
Admission: EM | Admit: 2013-03-29 | Discharge: 2013-03-29 | Disposition: A | Discharge status: Home / Self Care | Payer: Self-pay | Attending: Emergency Medicine | Admitting: Emergency Medicine

## 2013-03-29 ENCOUNTER — Encounter (HOSPITAL_COMMUNITY): Payer: Self-pay | Admitting: Emergency Medicine

## 2013-03-29 DIAGNOSIS — Z8701 Personal history of pneumonia (recurrent): Secondary | ICD-10-CM | POA: Insufficient documentation

## 2013-03-29 DIAGNOSIS — Z8679 Personal history of other diseases of the circulatory system: Secondary | ICD-10-CM | POA: Insufficient documentation

## 2013-03-29 DIAGNOSIS — F17200 Nicotine dependence, unspecified, uncomplicated: Secondary | ICD-10-CM | POA: Insufficient documentation

## 2013-03-29 DIAGNOSIS — Z792 Long term (current) use of antibiotics: Secondary | ICD-10-CM | POA: Insufficient documentation

## 2013-03-29 DIAGNOSIS — R0789 Other chest pain: Secondary | ICD-10-CM | POA: Insufficient documentation

## 2013-03-29 DIAGNOSIS — R079 Chest pain, unspecified: Secondary | ICD-10-CM

## 2013-03-29 HISTORY — DX: Pre-excitation syndrome: I45.6

## 2013-03-29 LAB — COMPREHENSIVE METABOLIC PANEL
ALBUMIN: 4.7 g/dL (ref 3.5–5.2)
ALT: 24 U/L (ref 0–53)
AST: 21 U/L (ref 0–37)
Alkaline Phosphatase: 81 U/L (ref 39–117)
BUN: 14 mg/dL (ref 6–23)
CALCIUM: 9.8 mg/dL (ref 8.4–10.5)
CO2: 24 mEq/L (ref 19–32)
Chloride: 101 mEq/L (ref 96–112)
Creatinine, Ser: 0.91 mg/dL (ref 0.50–1.35)
GFR calc non Af Amer: >90 mL/min (ref >90)
GLUCOSE: 90 mg/dL (ref 70–99)
POTASSIUM: 4.3 meq/L (ref 3.7–5.3)
SODIUM: 140 meq/L (ref 137–147)
TOTAL PROTEIN: 8.4 g/dL — AB (ref 6.0–8.3)
Total Bilirubin: 0.7 mg/dL (ref 0.3–1.2)

## 2013-03-29 LAB — URINE MICROSCOPIC-ADD ON

## 2013-03-29 LAB — CBC
HCT: 43.8 % (ref 39.0–52.0)
HEMOGLOBIN: 14.9 g/dL (ref 13.0–17.0)
MCH: 29.6 pg (ref 26.0–34.0)
MCHC: 34 g/dL (ref 30.0–36.0)
MCV: 86.9 fL (ref 78.0–100.0)
Platelets: 255 10*3/uL (ref 150–400)
RBC: 5.04 MIL/uL (ref 4.22–5.81)
RDW: 13 % (ref 11.5–15.5)
WBC: 7.6 10*3/uL (ref 4.0–10.5)

## 2013-03-29 LAB — URINALYSIS, ROUTINE W REFLEX MICROSCOPIC
Bilirubin Urine: NEGATIVE
GLUCOSE, UA: NEGATIVE mg/dL
Ketones, ur: NEGATIVE mg/dL
LEUKOCYTES UA: NEGATIVE
NITRITE: NEGATIVE
PROTEIN: NEGATIVE mg/dL
Specific Gravity, Urine: 1.022 (ref 1.005–1.030)
Urobilinogen, UA: 1 mg/dL (ref 0.0–1.0)
pH: 8 (ref 5.0–8.0)

## 2013-03-29 LAB — POCT I-STAT TROPONIN I
Troponin i, poc: 0 ng/mL (ref 0.00–0.08)
Troponin i, poc: 0.01 ng/mL (ref 0.00–0.08)

## 2013-03-29 MED ORDER — MORPHINE SULFATE 4 MG/ML IJ SOLN
4.0000 mg | Freq: Once | INTRAMUSCULAR | Status: AC
  Administered 2013-03-29: 4 mg via INTRAVENOUS
  Filled 2013-03-29: qty 1

## 2013-03-29 MED ORDER — MORPHINE SULFATE 4 MG/ML IJ SOLN: 4.0000 mg | Freq: Once | INTRAMUSCULAR | Status: DC

## 2013-03-29 MED ORDER — HYDROCODONE-ACETAMINOPHEN 5-325 MG PO TABS: 2.0000 | ORAL_TABLET | ORAL | Status: AC | PRN

## 2013-03-29 NOTE — ED Provider Notes (Signed)
CSN: [id number]     Arrival date & time 03/29/13  1208 History   First MD Initiated Contact with Patient 03/29/13 1220     Chief Complaint  Patient presents with  . Chest Pain   (Consider location/radiation/quality/duration/timing/severity/associated sxs/prior Treatment) HPI Comments: Patient presents with complaint of chest pain. Patient was admitted for chest pain and rapid heartbeat and diagnosed with Wolff-Parkinson-White approximately one week ago at [facility] (was admitted for 3 days). Patient was seen by a cardiologist, Dr. Chales Abrahams. He was started on flecainide and told not to work. He is being set up for an ablation, however this has not yet been scheduled. He voices some frustration because he wants to have this procedure performed as soon as possible. Pain started this morning around 2AM. It is sharp and radiates to the left arm. No shortness of breath, syncope, palpitations. Patient took a morphine tablet given to him by a family member without relief. The onset of this condition was acute. The course is constant. Aggravating factors: none. Alleviating factors: none.    Patient is a 28 y.o. male presenting with chest pain. The history is provided by the patient.  Chest Pain Associated symptoms: no abdominal pain, no back pain, no cough, no diaphoresis, no fever, no nausea, no palpitations, no shortness of breath and not vomiting     Past Medical History  Diagnosis Date  . Pneumonia 2014  . WPW (Wolff-Parkinson-White syndrome)    History reviewed. No pertinent past surgical history. No family history on file. History  Substance Use Topics  . Smoking status: Current Every Day Smoker -- 0.50 packs/day    Types: Cigarettes  . Smokeless tobacco: Not on file  . Alcohol Use: No    Review of Systems  Constitutional: Negative for fever and diaphoresis.  Eyes: Negative for redness.  Respiratory: Negative for cough and shortness of breath.   Cardiovascular: Positive  for chest pain. Negative for palpitations and leg swelling.  Gastrointestinal: Negative for nausea, vomiting and abdominal pain.  Genitourinary: Negative for dysuria.  Musculoskeletal: Negative for back pain and neck pain.  Skin: Negative for rash.  Neurological: Negative for syncope and light-headedness.    Allergies  Cheese and Ibuprofen  Home Medications   Current Outpatient Rx  Name  Route  Sig  Dispense  Refill  . azithromycin (ZITHROMAX) 250 MG tablet   Oral   Take 250 mg by mouth daily. 2 po day one, then 1 daily x 4 days         . Naproxen Sodium (ALEVE) 220 MG CAPS   Oral   Take 440 mg by mouth 2 (two) times daily as needed (pain).           BP 120/77  Pulse 73  Temp(Src) 98 F (36.7 C)  Resp 18  SpO2 100% Physical Exam  Nursing note and vitals reviewed. Constitutional: He appears well-developed and well-nourished.  HENT:  Head: Normocephalic and atraumatic.  Mouth/Throat: Mucous membranes are normal. Mucous membranes are not dry.  Eyes: Conjunctivae are normal.  Neck: Trachea normal and normal range of motion. Neck supple. Normal carotid pulses and no JVD present. No muscular tenderness present. Carotid bruit is not present. No tracheal deviation present.  Cardiovascular: Normal rate, regular rhythm, S1 normal, S2 normal, normal heart sounds and intact distal pulses.  Exam reveals no distant heart sounds and no decreased pulses.   No murmur heard. Pulmonary/Chest: Effort normal and breath sounds normal. No respiratory distress. He has no wheezes.  He exhibits tenderness.    Abdominal: Soft. Normal aorta and bowel sounds are normal. There is no tenderness. There is no rebound and no guarding.  Musculoskeletal: He exhibits no edema.  Neurological: He is alert.  Skin: Skin is warm and dry. He is not diaphoretic. No cyanosis. No pallor.  Psychiatric: He has a normal mood and affect.    ED Course  Procedures (including critical care time) Labs Review Labs  Reviewed  COMPREHENSIVE METABOLIC PANEL - Abnormal; Notable for the following:    Total Protein 8.4 (*)    All other components within normal limits  URINALYSIS, ROUTINE W REFLEX MICROSCOPIC - Abnormal; Notable for the following:    Hgb urine dipstick TRACE (*)    All other components within normal limits  URINE MICROSCOPIC-ADD ON - Abnormal; Notable for the following:    Bacteria, UA FEW (*)    All other components within normal limits  CBC  POCT I-STAT TROPONIN I  POCT I-STAT TROPONIN I   Imaging Review Dg Chest 2 View  03/29/2013   CLINICAL DATA:  Chest pain.  EXAM: CHEST  2 VIEW  COMPARISON:  03/27/2013  FINDINGS: Normal heart, mediastinum hila. Clear lungs. No pleural effusion or pneumothorax. Normal bony thorax.  IMPRESSION: No active cardiopulmonary disease.   Electronically Signed   By: Amie Portlandavid  Ormond M.D.   On: 03/29/2013 13:16    EKG Interpretation   None      12:34 PM Patient seen and examined. Work-up initiated. Medications ordered. D/w Dr. Judd LieneLo.   Vital signs reviewed and are as follows: Filed Vitals:   03/29/13 1430  BP: 113/65  Pulse: 64  Temp:   Resp: 14  BP 120/77  Pulse 73  Temp(Src) 98 F (36.7 C)  Resp 18  SpO2 100%   Date: 03/29/2013  Rate: 81  Rhythm: normal sinus rhythm  QRS Axis: normal  Intervals: normal  ST/T Wave abnormalities: normal  Conduction Disutrbances:WPW  Narrative Interpretation:   Old EKG Reviewed: none available  Patient informed of all results. Pain improved after morphine. HR stable. No SOB. He has done well while in ED.   Patient encouraged to continue meds, f/u with cardiologist for ablation. He voices that he really wants to get back to work.   Patient was counseled to return with severe chest pain, especially if the pain is crushing or pressure-like and spreads to the arms, back, neck, or jaw, or if they have sweating, nausea, or shortness of breath with the pain. They were encouraged to call 911 with these symptoms.    They were also told to return if their chest pain gets worse and does not go away with rest, they have an attack of chest pain lasting longer than usual despite rest and treatment with the medications their caregiver has prescribed, if they wake from sleep with chest pain or shortness of breath, if they feel dizzy or faint, if they have chest pain not typical of their usual pain, or if they have any other emergent concerns regarding their health.  The patient verbalized understanding and agreed.     MDM   1. Chest pain    Patient with WPW, this is being followed and ablation is planned. Pt on antiarrhythmic. No evidence of complication from WPW today.   Cardiac work-up otherwise unconcerning. Pain control given. Return instructions given. Pain is reproducible with palpation of chest wall. I think pain is likely MSK in nature. Doubt PE. He has follow-up.   No dangerous or  life-threatening conditions suspected or identified by history, physical exam, and by work-up. No indications for hospitalization identified.      Renne Crigler, PA-C 03/29/13 1529

## 2013-03-29 NOTE — Discharge Instructions (Signed)
Please read and follow all provided instructions.  Your diagnoses today include:  1. Chest pain     Tests performed today include:  An EKG of your heart  A chest x-ray  Cardiac enzymes - a blood test for heart muscle damage  Blood counts and electrolytes  Vital signs. See below for your results today.   Medications prescribed:   Vicodin (hydrocodone/acetaminophen) - narcotic pain medication  DO NOT drive or perform any activities that require you to be awake and alert because this medicine can make you drowsy. BE VERY CAREFUL not to take multiple medicines containing Tylenol (also called acetaminophen). Doing so can lead to an overdose which can damage your liver and cause liver failure and possibly death.  Take any prescribed medications only as directed.  Follow-up instructions: Please follow-up with your primary care provider as soon as you can for further evaluation of your symptoms. If you do not have a primary care doctor -- see below for referral information.   Return instructions:  SEEK IMMEDIATE MEDICAL ATTENTION IF:  You have severe chest pain, especially if the pain is crushing or pressure-like and spreads to the arms, back, neck, or jaw, or if you have sweating, nausea (feeling sick to your stomach), or shortness of breath. THIS IS AN EMERGENCY. Don't wait to see if the pain will go away. Get medical help at once. Call 911 or 0 (operator). DO NOT drive yourself to the hospital.   Your chest pain gets worse and does not go away with rest.   You have an attack of chest pain lasting longer than usual, despite rest and treatment with the medications your caregiver has prescribed.   You wake from sleep with chest pain or shortness of breath.  You feel dizzy or faint.  You have chest pain not typical of your usual pain for which you originally saw your caregiver.   You have any other emergent concerns regarding your health.  Additional Information: Chest pain  comes from many different causes. Your caregiver has diagnosed you as having chest pain that is not specific for one problem, but does not require admission.  You are at low risk for an acute heart condition or other serious illness.   Continue your home medications to control your heart rate.   Your vital signs today were: BP 120/77   Pulse 73   Temp(Src) 98 F (36.7 C)   Resp 18   SpO2 100% If your blood pressure (BP) was elevated above 135/85 this visit, please have this repeated by your doctor within one month. --------------  Emergency Department Resource Guide 1) Find a Doctor and Pay Out of Pocket Although you won't have to find out who is covered by your insurance plan, it is a good idea to ask around and get recommendations. You will then need to call the office and see if the doctor you have chosen will accept you as a new patient and what types of options they offer for patients who are self-pay. Some doctors offer discounts or will set up payment plans for their patients who do not have insurance, but you will need to ask so you aren't surprised when you get to your appointment.  2) Contact Your Local Health Department Not all health departments have doctors that can see patients for sick visits, but many do, so it is worth a call to see if yours does. If you don't know where your local health department is, you can check in your  phone book. The CDC also has a tool to help you locate your state's health department, and many state websites also have listings of all of their local health departments.  3) Find a Walk-in Clinic If your illness is not likely to be very severe or complicated, you may want to try a walk in clinic. These are popping up all over the country in pharmacies, drugstores, and shopping centers. They're usually staffed by nurse practitioners or physician assistants that have been trained to treat common illnesses and complaints. They're usually fairly quick and  inexpensive. However, if you have serious medical issues or chronic medical problems, these are probably not your best option.  No Primary Care Doctor: - Call Health Connect at  [phone] - they can help you locate a primary care doctor that  accepts your insurance, provides certain services, etc. - Physician Referral Service- [phone]  Chronic Pain Problems: Organization         Address  Phone   Notes  [facility]  [phone]) [phone] Patients need to be referred by their primary care doctor.   Medication Assistance: Organization         Address  Phone   Notes  [facility] Medication [facility] [address] [place], [place] [address] [phone]) [phone] --Must be a resident of [place] [facility] -- Must have NO insurance coverage whatsoever (no Medicaid/ Medicare, etc.) -- The pt. MUST have a primary care doctor that directs their care regularly and follows them in the community   MedAssist  [phone]   [employer]  [phone]    Agencies that provide inexpensive medical care: Organization         Address  Phone   Notes  [facility]  [phone]   [facility]    [phone]) [phone]   [facility] [address] [place], [place] [address] [phone]) [phone]   [facility] [address], [place] [phone]) [phone]   Planned Parenthood    [phone]) [phone]   [facility]    [phone]   [facility]  [address], [place] Phone:  [phone]) [phone], Fax:  [phone] Hours of Operation:  9 am - 6 pm, M-F.  Also accepts Medicaid/Medicare and self-pay.  [facility]  [address], [place] Phone: [phone](336) [phone], Fax: [phone]) [phone]. Hours of Operation:  8:30 am - 5:30 pm, M-F.  Also accepts Medicaid and self-pay.  [facility] [place] [address], [place] Phone: [phone]) [phone]   [facility] [address], [place], [place] [phone], Ext. [phone] Mondays & Thursdays: 7-9 AM.  First 15 patients are seen on a first come, first serve basis.    Medicaid-accepting [facility] Providers:  Organization         Address  Phone   Notes  [facility] [address], [place] [phone]) [phone] Also accepts self-pay patients.  [facility] [address], [place]  [phone]) [phone]   [facility] [address], [place] [phone]) [phone]   [facility] [address] [place] [address], [place] [phone](336) [phone]   Renaye RakersVeita Bland [address], [place]   [phone] Only accepts [place] Access [place] patients after they have their name applied to their card.  Self-Pay (no insurance) in [facility]:  Organization         Address  Phone   Notes  Sickle Cell Patients, [facility] [address], [place] [phone]) [phone]   [facility] [address], [place] [phone]) [phone]   [facility] [place]  [address], [place] [phone]   [facility]/Dr. Osei-Bonsu  [address] [place] [address], [place] or [id number], [place] [phone]) [phone] Phone number for both [place] and [place] locations is the same.  [facility] and [facility] [address], [place] [phone]) [phone]   [facility] [place] [address] [place] [address], [place] or [address] [phone](336) [phone]   [facility] [address], [place] [phone](336) [phone], phone; [phone]) [phone], fax Sees patients 1st and 3rd Saturday of every month.  Must not qualify for public or private insurance (i.e. Medicaid, Medicare, [place] Health Choice, Veterans' Benefits)  Household income should be no more than 200% of the poverty level The clinic cannot treat you if you are pregnant or  think you are pregnant  Sexually transmitted diseases are not treated at the clinic.    Dental Care: Organization         Address  Phone  Notes  [facility] Department of [facility] [address], [place] [phone]) [phone] Accepts children up to age 26 who are enrolled in [place]icaid or [place] Health Choice; pregnant women with a Medicaid card; and children who have applied for Medicaid or [place] Health Choice, but were declined, whose parents can pay a reduced fee at time of service.  [facility] of [facility] [place]  [address], [place] [phone]) [phone] Accepts children up to age 26 who are enrolled in [place]icaid or [place] Health Choice; pregnant women with a Medicaid card; and children who have applied for Medicaid or  Health Choice, but were declined, whose parents can pay a reduced fee at time of service.  [facility]  [address], [place] [phone]) [phone] Patients are seen by appointment only. Walk-ins are not accepted. [facility] will see patients 26 years of age and older. Monday - Tuesday (8am-5pm) Most Wednesdays (8:30-5pm) $30 per visit, cash only  [facility]  [address], [facility] [phone]) [phone] Patients are seen by appointment only. Walk-ins are not accepted. [facility] will see patients 26 years of age and older. One Wednesday Evening (Monthly: Volunteer Based).  $30 per visit, cash only  [employer] [facility]  [phone]) [phone] for adults; Children under age 124, call Graduate Pediatric Dentistry at [phone]. Children aged 244-14, please call [phone]) [phone] to request a pediatric application.  Dental services are provided in all areas of dental care including fillings, crowns and bridges, complete and partial dentures, implants, gum treatment, root canals, and extractions. Preventive care is also provided. Treatment is provided to both adults  and children. Patients are selected via a lottery and there is often a waiting list.   [facility] [address], [place]  [phone]) [phone] [email or web address]   [facility] [address], [place], [place] [phone], Ext. [phone] Second and Fourth Thursday of each month, opens at 6:30 AM; Clinic ends at 9 AM.  Patients are seen on a first-come first-served basis, and a limited number  are seen during each clinic.   [facility]  [address], [place], [place] [phone]) [phone]   Eligibility Requirements You must have lived in [place], [place], or [place] counties for at least the last three months.   You cannot be eligible for state or federal sponsored National Cityhealthcare insurance, including CIGNAVeterans Administration, [place]icaid, or [employer] EntertainmentMedicare.   You generally cannot be eligible for healthcare insurance through your employer.    How to apply: Eligibility screenings are held every Tuesday and Wednesday afternoon from 1:00 pm until 4:00 pm. You do not need an appointment for the interview!  [facility] [address], [place], [place] [phone]   [facility]  [phone]   [facility]  [phone]   [facility]  [phone]    Behavioral Health Resources in the Community: Intensive Outpatient Programs Organization         Address  Phone  Notes  [facility] [address], [place], [place] [phone]   [facility] [address], [place], [place] [phone]   [facility]: [facility] [address], [place], [place]  [phone]   [facility] [address],  [place], [place] [phone] or [phone]   Substance Abuse Resources Organization         Address  Phone  Notes  [facility] Drug Services  [phone]   [facility]  [phone]   [facility]  [phone]     [facility]  [phone]   Residential & Outpatient Substance Abuse Program  [phone]   Psychological Services Organization         Address  Phone  Notes  [facility]  [phone]- [phone]   [facility]  [phone]- [phone]   [facility] [address], [place] [phone] or [phone]    Mobile Crisis Teams Organization         Address  Phone  Notes  [facility], Mobile Crisis Care Unit  [phone]   [facility]  [address] [place], [place] [phone]   Doristine LocksSharon DeEsch [address] [place] [phone]    Self-Help/Support Groups Organization         Address  Phone             Notes  Mental Health Assoc. of Box - variety of support groups  336- [phone] Call for more information  Narcotics Anonymous (NA), Caring Services [address], [place]  2 meetings at this location   Statisticianesidential Treatment Programs Organization         Address  Phone  Notes  [facility] [address],    [place]  [phone]   [facility]  [address], [place] [phone], [place], [place] [phone]   [facility] [address], [place] [phone] Admissions: 8am-3pm M-F  [facility] [address]    [place], [place] [phone]   [facility] [address], [place], [place] [phone]   [facility] [address]  [place], [place] [phone]   [facility] - Intensive Outpatient [address], [place], [place] [phone]   [facility] ([facility] [address]  [place], [place] [phone] or [phone]   [facility] (RTS) [address] [place], [place] [phone] Accepts Medicaid  [facility] [address]  [place] [phone] Substance Abuse/Addiction Treatment   [facility] Organization         Address  Phone  Notes  [facility]  [phone]   Angie Fava, PhD [address] Kn[address] [place], [place]   [phone] or [phone]   [facility] Behavioral   [address] [place], [place] [phone]   [facility] [address], [place], [place] [phone] Insurance/Medicaid/sponsorship through [facility] and Families [address]                                    [place], [place] [phone] Therapy/tele-psych/case  [facility] [address] [place] [address], [place] [phone]    Dr. Lolly Mustache  [phone]   [facility]  [employer] [facility] Dept. 1) [address] [place] 2) [address] [place] 3)  [address], [place] [phone]  [phone]   [facility] Child Abuse Hotline [phone] or [phone] (After Hours)

## 2013-03-29 NOTE — ED Provider Notes (Signed)
Medical screening examination/treatment/procedure(s) were performed by non-physician practitioner and as supervising physician I was immediately available for consultation/collaboration.  EKG Interpretation    Date/Time:  Monday March 29 2013 12:12:56 EST Ventricular Rate:  81 PR Interval:  110 QRS Duration: 130 QT Interval:  426 QTC Calculation: 494 R Axis:   86 Text Interpretation:  Normal sinus rhythm Ventricular pre-excitation, WPW pattern type B Abnormal ECG Confirmed by Malva CoganELOS  MD, Kaetlin Bullen ([id number]) on 03/29/2013 3:32:23 PM             Geoffery Lyonsouglas Terea Neubauer, MD 03/29/13 [phone]

## 2013-03-29 NOTE — ED Notes (Signed)
Pt waiting to leave at 1445, post pain med administration.

## 2013-03-29 NOTE — ED Notes (Signed)
[facility] and dx w/ WPW and he has cp since  This am stabbing constant pain states is to have surgery bu no one has called him yet

## 2014-05-25 ENCOUNTER — Emergency Department (HOSPITAL_COMMUNITY): Payer: Self-pay

## 2014-05-25 ENCOUNTER — Encounter (HOSPITAL_COMMUNITY): Payer: Self-pay | Admitting: Emergency Medicine

## 2014-05-25 ENCOUNTER — Emergency Department (HOSPITAL_COMMUNITY)
Admission: EM | Admit: 2014-05-25 | Discharge: 2014-05-25 | Discharge status: Against Medical Advice | Payer: Self-pay | Attending: Emergency Medicine | Admitting: Emergency Medicine

## 2014-05-25 DIAGNOSIS — Z720 Tobacco use: Secondary | ICD-10-CM | POA: Insufficient documentation

## 2014-05-25 DIAGNOSIS — I456 Pre-excitation syndrome: Secondary | ICD-10-CM | POA: Insufficient documentation

## 2014-05-25 DIAGNOSIS — Z8701 Personal history of pneumonia (recurrent): Secondary | ICD-10-CM | POA: Insufficient documentation

## 2014-05-25 DIAGNOSIS — R079 Chest pain, unspecified: Secondary | ICD-10-CM | POA: Insufficient documentation

## 2014-05-25 DIAGNOSIS — Z79899 Other long term (current) drug therapy: Secondary | ICD-10-CM | POA: Insufficient documentation

## 2014-05-25 LAB — CBC WITH DIFFERENTIAL/PLATELET
BASOS PCT: 1 % (ref 0–1)
Basophils Absolute: 0.1 10*3/uL (ref 0.0–0.1)
Eosinophils Absolute: 0.2 10*3/uL (ref 0.0–0.7)
Eosinophils Relative: 4 % (ref 0–5)
HEMATOCRIT: 39.3 % (ref 39.0–52.0)
Hemoglobin: 13.1 g/dL (ref 13.0–17.0)
Lymphocytes Relative: 42 % (ref 12–46)
Lymphs Abs: 2.4 10*3/uL (ref 0.7–4.0)
MCH: 29 pg (ref 26.0–34.0)
MCHC: 33.3 g/dL (ref 30.0–36.0)
MCV: 86.9 fL (ref 78.0–100.0)
MONOS PCT: 8 % (ref 3–12)
Monocytes Absolute: 0.4 10*3/uL (ref 0.1–1.0)
Neutro Abs: 2.6 10*3/uL (ref 1.7–7.7)
Neutrophils Relative %: 45 % (ref 43–77)
Platelets: 180 10*3/uL (ref 150–400)
RBC: 4.52 MIL/uL (ref 4.22–5.81)
RDW: 12.8 % (ref 11.5–15.5)
WBC: 5.7 10*3/uL (ref 4.0–10.5)

## 2014-05-25 LAB — COMPREHENSIVE METABOLIC PANEL
ALK PHOS: 58 U/L (ref 39–117)
ALT: 26 U/L (ref 0–53)
AST: 29 U/L (ref 0–37)
Albumin: 4.4 g/dL (ref 3.5–5.2)
Anion gap: 10 (ref 5–15)
BUN: 14 mg/dL (ref 6–23)
CO2: 25 mmol/L (ref 19–32)
CREATININE: 1 mg/dL (ref 0.50–1.35)
Calcium: 9.8 mg/dL (ref 8.4–10.5)
Chloride: 105 mmol/L (ref 96–112)
GFR calc Af Amer: >90 mL/min (ref >90)
GFR calc non Af Amer: >90 mL/min (ref >90)
Glucose, Bld: 102 mg/dL — ABNORMAL HIGH (ref 70–99)
Potassium: 4.3 mmol/L (ref 3.5–5.1)
Sodium: 140 mmol/L (ref 135–145)
TOTAL PROTEIN: 6.8 g/dL (ref 6.0–8.3)
Total Bilirubin: 0.9 mg/dL (ref 0.3–1.2)

## 2014-05-25 LAB — TROPONIN I: Troponin I: <0.03 ng/mL (ref <0.031)

## 2014-05-25 MED ORDER — MORPHINE SULFATE 4 MG/ML IJ SOLN
4.0000 mg | Freq: Once | INTRAMUSCULAR | Status: AC
  Administered 2014-05-25: 4 mg via INTRAVENOUS
  Filled 2014-05-25: qty 1

## 2014-05-25 MED ORDER — ONDANSETRON HCL 4 MG/2ML IJ SOLN: 4.0000 mg | Freq: Once | INTRAMUSCULAR | Status: DC

## 2014-05-25 MED ORDER — ONDANSETRON HCL 4 MG/2ML IJ SOLN
4.0000 mg | Freq: Once | INTRAMUSCULAR | Status: AC
  Administered 2014-05-25: 4 mg via INTRAVENOUS
  Filled 2014-05-25: qty 2

## 2014-05-25 MED ORDER — MORPHINE SULFATE 4 MG/ML IJ SOLN
4.0000 mg | Freq: Once | INTRAMUSCULAR | Status: DC
Start: 2014-05-25 — End: 2014-05-25

## 2014-05-25 NOTE — ED Provider Notes (Signed)
CSN: [id number]     Arrival date & time 05/25/14  0707 History   None    Chief Complaint  Patient presents with  . Chest Pain     (Consider location/radiation/quality/duration/timing/severity/associated sxs/prior Treatment) Patient is a 29 y.o. male presenting with chest pain. The history is provided by the patient. No language interpreter was used.  Chest Pain Pain location:  L lateral chest and R lateral chest Pain quality: aching   Pain radiates to:  Does not radiate Pain radiates to the back: no   Pain severity:  Moderate Onset quality:  Gradual Timing:  Constant Progression:  Worsening Chronicity:  New Context: not breathing   Relieved by:  Nothing Worsened by:  Nothing tried Associated symptoms: no abdominal pain, no back pain and no cough   Risk factors: no hypertension   Pt has a history of wpw.  Pt reports he has been trying to save money to have an ablation.  Pt is working light duty.  Pt has no insurance. +  Past Medical History  Diagnosis Date  . Pneumonia 2014  . WPW (Wolff-Parkinson-White syndrome)    No past surgical history on file. No family history on file. History  Substance Use Topics  . Smoking status: Current Every Day Smoker -- 0.50 packs/day    Types: Cigarettes  . Smokeless tobacco: Not on file  . Alcohol Use: No    Review of Systems  Respiratory: Negative for cough.   Cardiovascular: Positive for chest pain.  Gastrointestinal: Negative for abdominal pain.  Musculoskeletal: Negative for back pain.  All other systems reviewed and are negative.     Allergies  Cheese and Ibuprofen  Home Medications   Prior to Admission medications   Medication Sig Start Date End Date Taking? Authorizing Provider  carvedilol (COREG) 3.125 MG tablet Take 3.125 mg by mouth 2 (two) times daily with a meal.    Historical Provider, MD  flecainide (TAMBOCOR) 50 MG tablet Take 50 mg by mouth 2 (two) times daily.    Historical Provider, MD   HYDROcodone-acetaminophen (NORCO/VICODIN) 5-325 MG per tablet Take 2 tablets by mouth every 4 (four) hours as needed. 03/29/13   Renne Crigler, PA-C  ramipril (ALTACE) 5 MG capsule Take 5 mg by mouth 2 (two) times daily.    Historical Provider, MD   There were no vitals taken for this visit. Physical Exam  Constitutional: He appears well-developed and well-nourished.  HENT:  Head: Normocephalic and atraumatic.  Eyes: Pupils are equal, round, and reactive to light.  Neck: Normal range of motion.  Cardiovascular: Normal rate and normal heart sounds.   Pulmonary/Chest: Effort normal and breath sounds normal.  Abdominal: Soft.  Musculoskeletal: Normal range of motion.  Neurological: He is alert.  Skin: Skin is warm.  Psychiatric: He has a normal mood and affect.  Nursing note and vitals reviewed.   ED Course  Procedures (including critical care time) Labs Review Labs Reviewed - No data to display  Imaging Review Dg Chest 2 View  05/25/2014   CLINICAL DATA:  Chest pain, left arm pain  EXAM: CHEST  2 VIEW  COMPARISON:  04/14/2014  FINDINGS: Cardiomediastinal silhouette is stable. No acute infiltrate or pleural effusion. No pulmonary edema. Bony thorax is unremarkable.  IMPRESSION: No active cardiopulmonary disease.   Electronically Signed   By: Mason Coleman M.D.   On: 05/25/2014 08:08     EKG Interpretation   Date/Time:  Wednesday May 25 2014 07:16:42 EDT Ventricular Rate:  75 PR Interval:  110 QRS Duration: 136 QT Interval:  442 QTC Calculation: 493 R Axis:   85 Text Interpretation:  Sinus rhythm with occasional Premature ventricular  complexes Ventricular pre-excitation, WPW pattern type B Abnormal ECG No  significant change was found Confirmed by Mason Coleman ([id number]) on  05/25/2014 7:28:59 AM      MDM labs reviewed.  I attempted to consult cardiology.  Pt decided to leave ama. I explained to pt that I am trying to get him the care he needs.  Pt says he is going home  and will [facility].  (Pt reported earlier he could not afford to see) Pt understands risk associated with his decision.    Final diagnoses:  Chest pain  WPW (Wolff-Parkinson-White syndrome)        Mason AreasLeslie K Darlis Wragg, PA-C 05/25/14 1028  Mason OctaveStephen Rancour, MD 05/25/14 1603  Mason OctaveStephen Rancour, MD 05/25/14 [phone]

## 2014-05-25 NOTE — ED Notes (Signed)
Patient states he has had constant Chest pain since 3 am. Patient states, " It feel like someone is sitting on my chest". Patient states pain radiates to left and back. Patient states pain 9/10. Patient also complains of dizziness and SOB. Patient 99% on RA with unlabored breathing.

## 2014-05-25 NOTE — ED Notes (Signed)
Pt placed in gown and in bed. Pt monitored by pulse ox, bp cuff, and 5-lead. 

## 2014-05-25 NOTE — ED Notes (Signed)
Pa notified patient states, "he is ready to go and wants to leave" PA spoke with Patient and went over the risk of leaving including death.

## 2014-05-25 NOTE — ED Notes (Signed)
Patient give a soda and sandwich,

## 2016-07-28 IMAGING — CR DG CHEST 2V
2 series · 2 of 2 positions shown · non-contrast
Comparison: 04/14/2014

CLINICAL DATA: Chest pain, left arm pain

EXAM:
CHEST  2 VIEW

[chest pa]
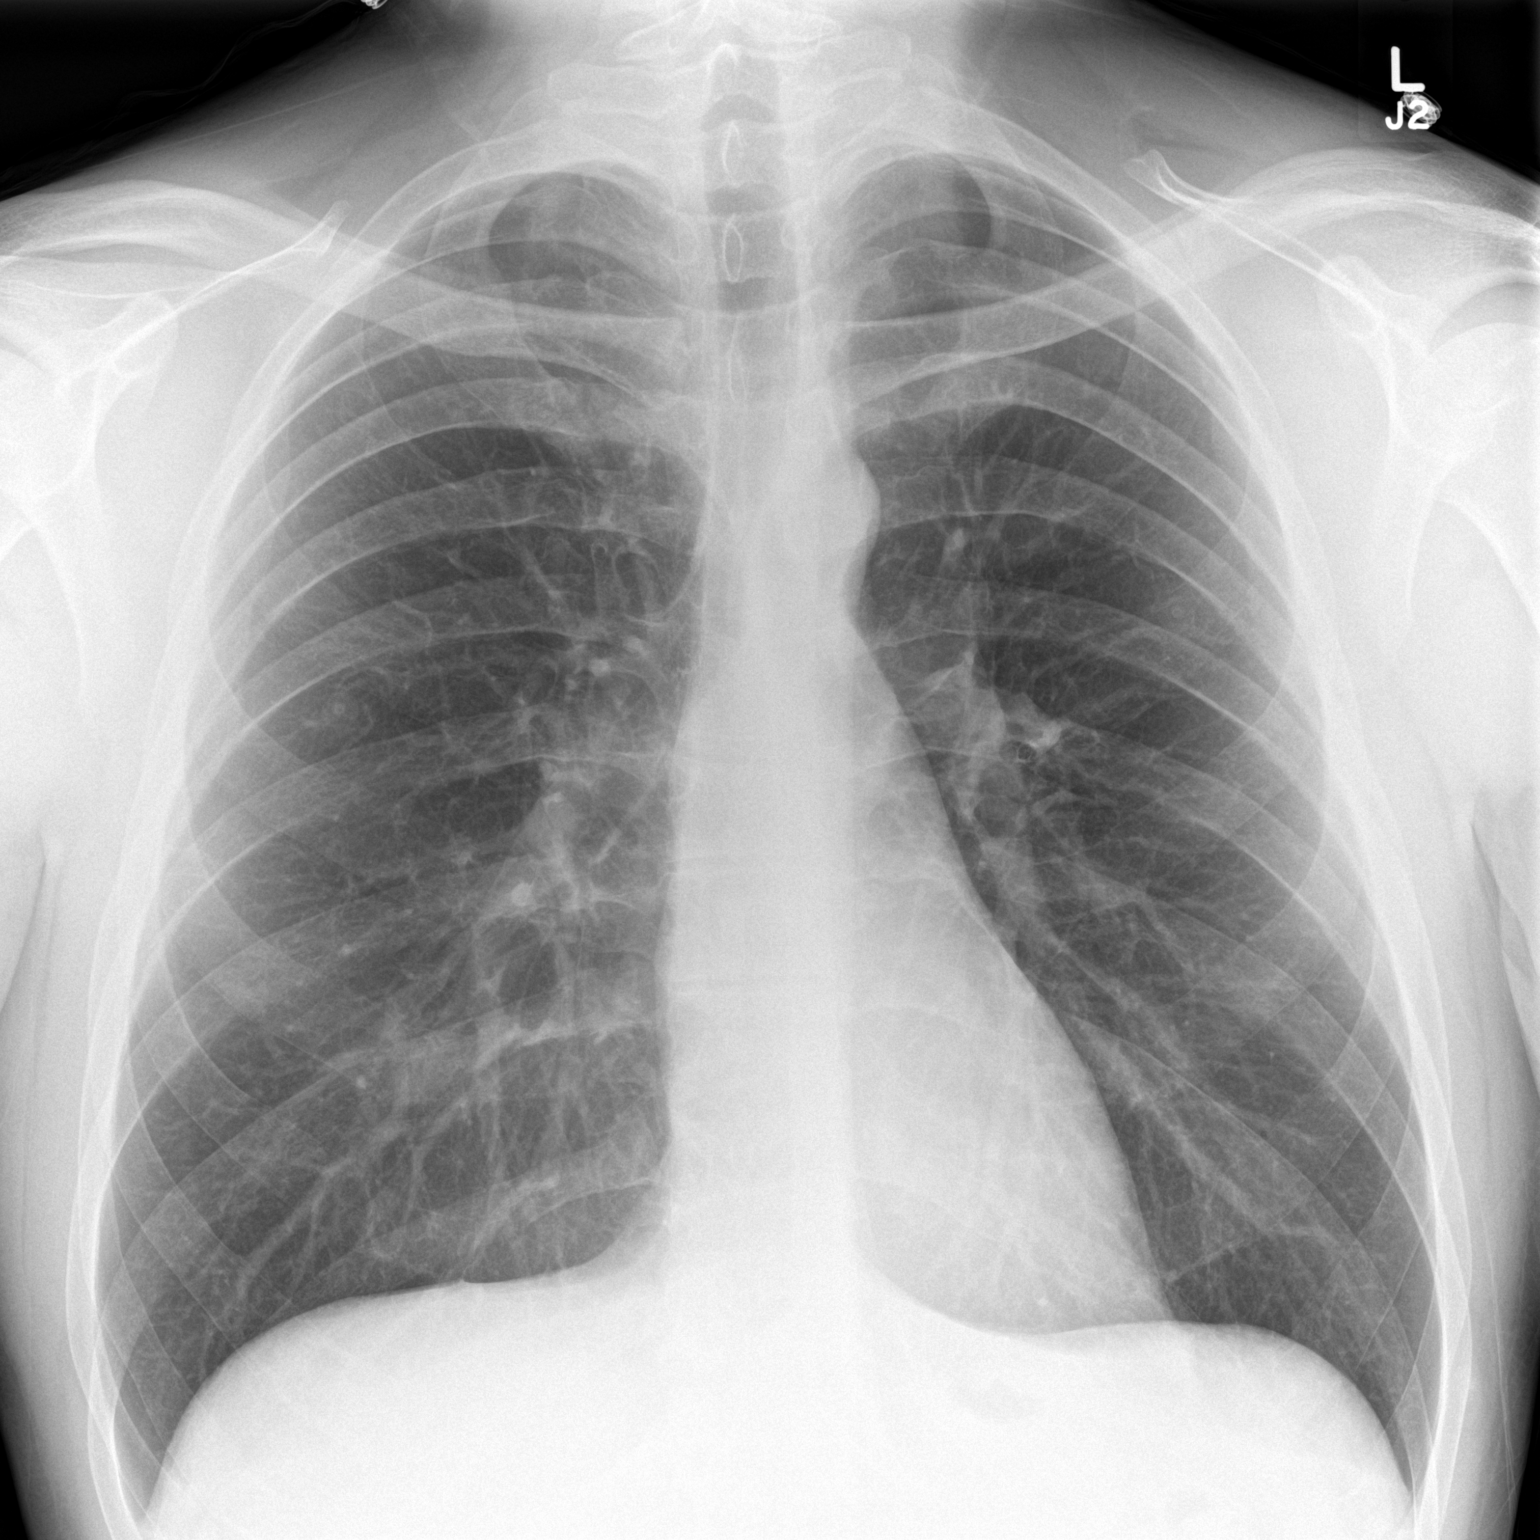

[chest lat]
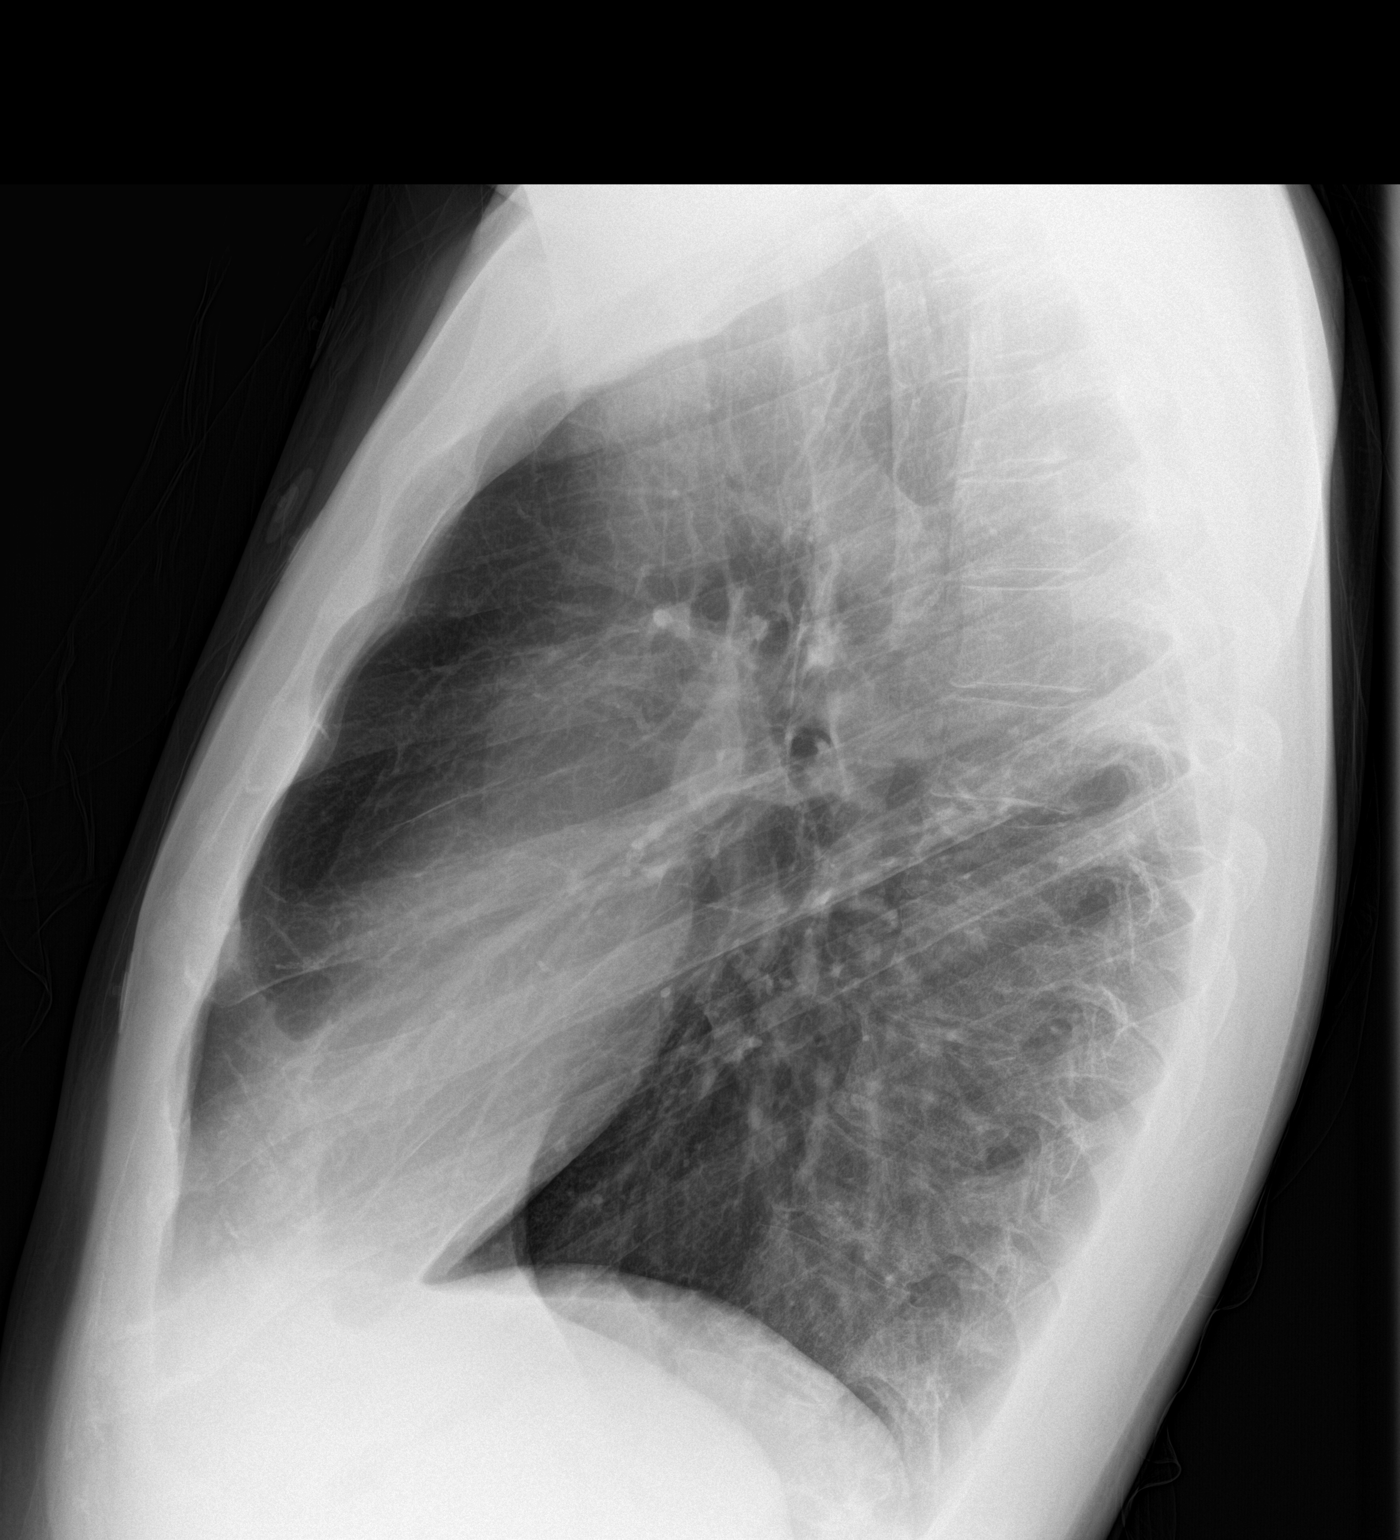

[2 of 2 positions shown; findings below may reference images not displayed]

FINDINGS: Cardiomediastinal silhouette is stable. No acute infiltrate or
pleural effusion. No pulmonary edema. Bony thorax is unremarkable.
IMPRESSION: No active cardiopulmonary disease.

## 2018-10-09 ENCOUNTER — Other Ambulatory Visit: Payer: Self-pay

## 2018-10-09 ENCOUNTER — Emergency Department (HOSPITAL_BASED_OUTPATIENT_CLINIC_OR_DEPARTMENT_OTHER)
Admission: EM | Admit: 2018-10-09 | Discharge: 2018-10-09 | Disposition: A | Discharge status: Home / Self Care | Payer: No Typology Code available for payment source | Attending: Emergency Medicine | Admitting: Emergency Medicine

## 2018-10-09 ENCOUNTER — Encounter (HOSPITAL_BASED_OUTPATIENT_CLINIC_OR_DEPARTMENT_OTHER): Payer: Self-pay

## 2018-10-09 DIAGNOSIS — Y9289 Other specified places as the place of occurrence of the external cause: Secondary | ICD-10-CM | POA: Insufficient documentation

## 2018-10-09 DIAGNOSIS — Z7982 Long term (current) use of aspirin: Secondary | ICD-10-CM | POA: Insufficient documentation

## 2018-10-09 DIAGNOSIS — R110 Nausea: Secondary | ICD-10-CM | POA: Diagnosis not present

## 2018-10-09 DIAGNOSIS — Y9389 Activity, other specified: Secondary | ICD-10-CM | POA: Diagnosis not present

## 2018-10-09 DIAGNOSIS — Y29XXXA Contact with blunt object, undetermined intent, initial encounter: Secondary | ICD-10-CM | POA: Diagnosis not present

## 2018-10-09 DIAGNOSIS — S098XXA Other specified injuries of head, initial encounter: Secondary | ICD-10-CM | POA: Insufficient documentation

## 2018-10-09 DIAGNOSIS — F17210 Nicotine dependence, cigarettes, uncomplicated: Secondary | ICD-10-CM | POA: Diagnosis not present

## 2018-10-09 DIAGNOSIS — S0990XA Unspecified injury of head, initial encounter: Secondary | ICD-10-CM

## 2018-10-09 DIAGNOSIS — Y990 Civilian activity done for income or pay: Secondary | ICD-10-CM | POA: Insufficient documentation

## 2018-10-09 MED ORDER — ONDANSETRON 4 MG PO TBDP
4.0000 mg | ORAL_TABLET | Freq: Once | ORAL | Status: AC
  Administered 2018-10-09: 4 mg via ORAL
  Filled 2018-10-09: qty 1

## 2018-10-09 MED ORDER — ACETAMINOPHEN 325 MG PO TABS
650.0000 mg | ORAL_TABLET | Freq: Once | ORAL | Status: AC
  Administered 2018-10-09: 650 mg via ORAL
  Filled 2018-10-09: qty 2

## 2018-10-09 NOTE — Discharge Instructions (Signed)
Ibuprofen or Tylenol for pain Rest. Staying in a quiet, non-simulating, dark environment can help with headache, nausea, dizziness. No TV, computer use, video games until headache is resolved completely. Screen time like this makes headache worse. Follow up with your primary care physician if headache persists.  Return to the emergency department for numbness or weakness in your arms/legs, vomiting, change in mental status, new or worsening symptoms, any additional concerns.

## 2018-10-09 NOTE — ED Notes (Signed)
Per patients supervisor at [employer], no need for UDS to be collected.

## 2018-10-09 NOTE — ED Triage Notes (Addendum)
Pt states he hit the top of his had on a metal rack at work ~1pm-130pm-no LOC-no break in skin noted-c/o pain to site, nausea, dizziness-NAD-steady gait

## 2018-10-09 NOTE — ED Notes (Signed)
Pt hit head into metal shelving while working at [employer].  States slightly dizzy and nauseous, denies vomiting, denies vision changes.  Ambulates with steady gait.  Was not able to complete the work day, arrives for evaluation.

## 2018-10-09 NOTE — ED Provider Notes (Signed)
[facility] EMERGENCY DEPARTMENT Provider Note   CSN: [id number] Arrival date & time: 10/09/18  1418     History   Chief Complaint Chief Complaint  Patient presents with  . Head Injury    HPI Mason Coleman is a 33 y.o. male.     The history is provided by the patient and medical records. No language interpreter was used.  Head Injury Associated symptoms: headache and nausea   Associated symptoms: no numbness and no vomiting    Mason Coleman is a 33 y.o. male  with a PMH of WPW who presents to the Emergency Department complaining of head injury while at work just prior to arrival. 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 He did not have LOC but reports pain hurt so badly that it brought him to his knees. He reports nausea currently without vomiting.  No medications taken prior to arrival for symptoms.  Not on anticoagulants.  Past Medical History:  Diagnosis Date  . Pneumonia 2014  . WPW (Wolff-Parkinson-White syndrome)     There are no active problems to display for this patient.   History reviewed. No pertinent surgical history.      Home Medications    Prior to Admission medications   Medication Sig Start Date End Date Taking? Authorizing Provider  acetaminophen (TYLENOL) 500 MG tablet Take 1,000 mg by mouth every 6 (six) hours as needed.    [provider]  aspirin EC 81 MG tablet Take 81 mg by mouth daily.    [provider]  carvedilol (COREG) 3.125 MG tablet Take 3.125 mg by mouth 2 (two) times daily with a meal.    [provider]  flecainide (TAMBOCOR) 50 MG tablet Take 50 mg by mouth 2 (two) times daily.    [provider]  HYDROcodone-acetaminophen (NORCO/VICODIN) 5-325 MG per tablet Take 2 tablets by mouth every 4 (four) hours as needed. Patient not taking: Reported on 05/25/2014 03/29/13   Renne CriglerGeiple, Joshua, PA-C   ramipril (ALTACE) 5 MG capsule Take 5 mg by mouth 2 (two) times daily.    [provider]    Family History No family history on file.  Social History Social History   Tobacco Use  . Smoking status: Current Every Day Smoker    Packs/day: 0.50    Types: Cigarettes  . Smokeless tobacco: Never Used  Substance Use Topics  . Alcohol use: Yes    Comment: weekly  . Drug use: Not Currently    Types: Oxycodone, Marijuana    Comment: Opiate abuse     Allergies   Patient has no active allergies.   Review of Systems Review of Systems  Respiratory: Negative for shortness of breath.   Cardiovascular: Negative for chest pain.  Gastrointestinal: Positive for nausea. Negative for abdominal pain, constipation, diarrhea and vomiting.  Neurological: Positive for light-headedness and headaches. Negative for syncope, weakness and numbness.     Physical Exam Updated Vital Signs BP (!) 151/88 (BP Location: Left Arm)   Pulse 86   Temp 97.8 F (36.6 C) (Oral)   Resp 16   Ht 6\' 6"  (1.981 m)   Wt 106.6 kg   SpO2 100%   BMI 27.16 kg/m   Physical Exam Vitals signs and nursing note reviewed.  Constitutional:      General: He is not in acute distress.    Appearance: He is well-developed.  HENT:  Head: Normocephalic.     Comments: Atraumatic.  No battle signs or raccoon eyes.  No hemotympanum.  Nose normal. Neck:     Musculoskeletal: Neck supple.     Comments: Mild diffuse tenderness midline and bilaterally. Full ROM without difficulty.  Cardiovascular:     Rate and Rhythm: Normal rate and regular rhythm.     Heart sounds: Normal heart sounds. No murmur.  Pulmonary:     Effort: Pulmonary effort is normal. No respiratory distress.     Breath sounds: Normal breath sounds.  Abdominal:     General: There is no distension.     Palpations: Abdomen is soft.     Tenderness: There is no abdominal tenderness.  Skin:    General: Skin is warm and dry.  Neurological:      Mental Status: He is alert and oriented to person, place, and time.     Comments: Alert, oriented, thought content appropriate, able to give a coherent history. Speech is clear and goal oriented, able to follow commands.  Cranial Nerves:  II:  Peripheral visual fields grossly normal, pupils equal, round, reactive to light III, IV, VI: EOM intact bilaterally, ptosis not present V,VII: smile symmetric, eyes kept closed tightly against resistance, facial light touch sensation equal VIII: hearing grossly normal IX, X: symmetric soft palate movement, uvula elevates symmetrically  XI: bilateral shoulder shrug symmetric and strong XII: midline tongue extension 5/5 muscle strength in upper and lower extremities bilaterally including strong and equal grip strength and dorsiflexion/plantar flexion Sensory to light touch normal in all four extremities.  Normal finger-to-nose and rapid alternating movements; normal gait and balance. Negative romberg, no pronator drift.      ED Treatments / Results  Labs (all labs ordered are listed, but only abnormal results are displayed) Labs Reviewed - No data to display  EKG None  Radiology No results found.  Procedures Procedures (including critical care time)  Medications Ordered in ED Medications  ondansetron (ZOFRAN-ODT) disintegrating tablet 4 mg (has no administration in time range)  acetaminophen (TYLENOL) tablet 650 mg (has no administration in time range)     Initial Impression / Assessment and Plan / ED Course  I have reviewed the triage vital signs and the nursing notes.  Pertinent labs & imaging results that were available during my care of the patient were reviewed by me and considered in my medical decision making (see chart for details).        Mason Coleman is a 33 y.o. male who presents to ED for evaluation after head injury just prior to arrival at work.  No focal neurologic deficits on exam.  [place] CT head rule recommended  no CT imaging.  I agree.  Mechanism sounds minor, exam atraumatic and not on anticoagulants.  He does have some mild diffuse tenderness to the neck.  I did speak with him about obtaining CT or plain film of the neck.  He states that he needs to pick up his child and does not know if he can stay long enough to get this imaging done.  I feel it is probably low yield given negative swelling in his exam, but it was recommended.  We discussed risks and benefits and he would prefer to hold on imaging. Understands return precautions. All questions answered.    Final Clinical Impressions(s) / ED Diagnoses   Final diagnoses:  Minor head injury, initial encounter    ED Discharge Orders    None       Mason Coleman,  Chase PicketJaime Pilcher, PA-C 10/09/18 1608    Charlynne PanderYao, David Hsienta, MD 10/09/18 [phone]
